# Patient Record
Sex: Female | Born: 2000 | Race: Black or African American | Hispanic: No | Marital: Single | Smoking: Never smoker
Health system: Southern US, Community
[De-identification: ages and names within clinical notes are randomized; demographics above are authoritative.]

## PROBLEM LIST (undated history)

## (undated) DIAGNOSIS — E282 Polycystic ovarian syndrome: Secondary | ICD-10-CM

## (undated) DIAGNOSIS — J45909 Unspecified asthma, uncomplicated: Secondary | ICD-10-CM

---
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
## 2020-02-17 ENCOUNTER — Emergency Department (HOSPITAL_BASED_OUTPATIENT_CLINIC_OR_DEPARTMENT_OTHER)
Admission: EM | Admit: 2020-02-17 | Discharge: 2020-02-17 | Disposition: A | Discharge status: Home / Self Care | Payer: No Typology Code available for payment source | Attending: Emergency Medicine | Admitting: Emergency Medicine

## 2020-02-17 ENCOUNTER — Other Ambulatory Visit: Payer: Self-pay

## 2020-02-17 ENCOUNTER — Encounter (HOSPITAL_BASED_OUTPATIENT_CLINIC_OR_DEPARTMENT_OTHER): Payer: Self-pay | Admitting: Oncology

## 2020-02-17 ENCOUNTER — Emergency Department (HOSPITAL_BASED_OUTPATIENT_CLINIC_OR_DEPARTMENT_OTHER): Payer: No Typology Code available for payment source

## 2020-02-17 DIAGNOSIS — J45909 Unspecified asthma, uncomplicated: Secondary | ICD-10-CM | POA: Insufficient documentation

## 2020-02-17 DIAGNOSIS — M25512 Pain in left shoulder: Secondary | ICD-10-CM | POA: Insufficient documentation

## 2020-02-17 DIAGNOSIS — R0789 Other chest pain: Secondary | ICD-10-CM | POA: Diagnosis not present

## 2020-02-17 DIAGNOSIS — S298XXA Other specified injuries of thorax, initial encounter: Secondary | ICD-10-CM

## 2020-02-17 MED ORDER — NAPROXEN 250 MG PO TABS
500.0000 mg | ORAL_TABLET | Freq: Once | ORAL | Status: AC
  Administered 2020-02-17: 500 mg via ORAL
  Filled 2020-02-17: qty 2

## 2020-02-17 MED ORDER — NAPROXEN 375 MG PO TABS: ORAL_TABLET | ORAL | 0 refills | Status: AC
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
## 2020-02-18 ENCOUNTER — Emergency Department (HOSPITAL_BASED_OUTPATIENT_CLINIC_OR_DEPARTMENT_OTHER): Payer: No Typology Code available for payment source

## 2020-02-18 ENCOUNTER — Encounter (HOSPITAL_BASED_OUTPATIENT_CLINIC_OR_DEPARTMENT_OTHER): Payer: Self-pay | Admitting: Emergency Medicine

## 2020-02-18 ENCOUNTER — Emergency Department (HOSPITAL_BASED_OUTPATIENT_CLINIC_OR_DEPARTMENT_OTHER)
Admission: EM | Admit: 2020-02-18 | Discharge: 2020-02-18 | Disposition: A | Discharge status: Home / Self Care | Payer: No Typology Code available for payment source | Attending: Emergency Medicine | Admitting: Emergency Medicine

## 2020-02-18 ENCOUNTER — Other Ambulatory Visit: Payer: Self-pay

## 2020-02-18 DIAGNOSIS — M62838 Other muscle spasm: Secondary | ICD-10-CM | POA: Insufficient documentation

## 2020-02-18 DIAGNOSIS — R0602 Shortness of breath: Secondary | ICD-10-CM | POA: Diagnosis present

## 2020-02-18 MED ORDER — CYCLOBENZAPRINE HCL 10 MG PO TABS: 10.0000 mg | ORAL_TABLET | Freq: Two times a day (BID) | ORAL | 0 refills | Status: AC | PRN
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
## 2020-02-23 ENCOUNTER — Encounter: Payer: Self-pay | Admitting: Family Medicine

## 2020-02-23 ENCOUNTER — Other Ambulatory Visit: Payer: Self-pay

## 2020-02-23 ENCOUNTER — Ambulatory Visit (INDEPENDENT_AMBULATORY_CARE_PROVIDER_SITE_OTHER): Payer: No Typology Code available for payment source | Admitting: Family Medicine

## 2020-02-23 VITALS — BP 119/80 | HR 99 | Ht 61.0 in | Wt 210.0 lb

## 2020-02-23 DIAGNOSIS — M25512 Pain in left shoulder: Secondary | ICD-10-CM | POA: Diagnosis not present

## 2020-02-23 DIAGNOSIS — M542 Cervicalgia: Secondary | ICD-10-CM | POA: Insufficient documentation

## 2020-02-23 DIAGNOSIS — M546 Pain in thoracic spine: Secondary | ICD-10-CM | POA: Insufficient documentation

## 2020-02-23 DIAGNOSIS — M7918 Myalgia, other site: Secondary | ICD-10-CM | POA: Insufficient documentation

## 2020-02-23 NOTE — Patient Instructions (Signed)
Nice to meet you Please try a thera cane  Please try heat  Please try the exercises  Physical therapy will give you a call  Please send me a message in MyChart with any questions or updates.  Please see me back in 4 weeks.   --Dr. Jordan Likes

## 2020-02-23 NOTE — Assessment & Plan Note (Signed)
Seems muscular in nature.  No signs of fracture. -Counseled on home exercise therapy and supportive care. -Referral to physical therapy. -Could consider imaging or trigger point injections.
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
## 2020-02-23 NOTE — Assessment & Plan Note (Signed)
Likely myofascial in nature. -Referral to physical therapy. -Counseled on home exercise therapy and supportive care. -Consider imaging or trigger point injections.

## 2020-03-02 ENCOUNTER — Other Ambulatory Visit: Payer: Self-pay

## 2020-03-02 ENCOUNTER — Ambulatory Visit: Payer: No Typology Code available for payment source | Attending: Family Medicine | Admitting: Physical Therapy

## 2020-03-02 ENCOUNTER — Encounter: Payer: Self-pay | Admitting: Physical Therapy

## 2020-03-02 DIAGNOSIS — M546 Pain in thoracic spine: Secondary | ICD-10-CM | POA: Diagnosis present

## 2020-03-02 DIAGNOSIS — R29898 Other symptoms and signs involving the musculoskeletal system: Secondary | ICD-10-CM | POA: Insufficient documentation

## 2020-03-02 DIAGNOSIS — M542 Cervicalgia: Secondary | ICD-10-CM | POA: Insufficient documentation

## 2020-03-02 DIAGNOSIS — R293 Abnormal posture: Secondary | ICD-10-CM | POA: Diagnosis present

## 2020-03-02 DIAGNOSIS — M25512 Pain in left shoulder: Secondary | ICD-10-CM | POA: Insufficient documentation
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
## 2020-03-09 ENCOUNTER — Ambulatory Visit: Payer: No Typology Code available for payment source | Admitting: Physical Therapy

## 2020-03-10 ENCOUNTER — Ambulatory Visit: Payer: No Typology Code available for payment source | Admitting: Physical Therapy

## 2020-03-10 ENCOUNTER — Other Ambulatory Visit: Payer: Self-pay

## 2020-03-10 ENCOUNTER — Encounter: Payer: Self-pay | Admitting: Physical Therapy

## 2020-03-10 DIAGNOSIS — M25512 Pain in left shoulder: Secondary | ICD-10-CM | POA: Diagnosis not present

## 2020-03-10 DIAGNOSIS — R29898 Other symptoms and signs involving the musculoskeletal system: Secondary | ICD-10-CM

## 2020-03-10 DIAGNOSIS — M546 Pain in thoracic spine: Secondary | ICD-10-CM

## 2020-03-10 DIAGNOSIS — R293 Abnormal posture: Secondary | ICD-10-CM

## 2020-03-10 DIAGNOSIS — M542 Cervicalgia: Secondary | ICD-10-CM
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
## 2020-03-11 ENCOUNTER — Ambulatory Visit: Payer: No Typology Code available for payment source | Admitting: Physical Therapy

## 2020-03-11 ENCOUNTER — Encounter: Payer: Self-pay | Admitting: Physical Therapy

## 2020-03-11 DIAGNOSIS — M25512 Pain in left shoulder: Secondary | ICD-10-CM

## 2020-03-11 DIAGNOSIS — M542 Cervicalgia: Secondary | ICD-10-CM

## 2020-03-11 DIAGNOSIS — R29898 Other symptoms and signs involving the musculoskeletal system: Secondary | ICD-10-CM

## 2020-03-11 DIAGNOSIS — R293 Abnormal posture: Secondary | ICD-10-CM

## 2020-03-11 DIAGNOSIS — M546 Pain in thoracic spine: Secondary | ICD-10-CM
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
## 2020-03-16 ENCOUNTER — Other Ambulatory Visit: Payer: Self-pay

## 2020-03-16 ENCOUNTER — Ambulatory Visit: Payer: No Typology Code available for payment source

## 2020-03-16 DIAGNOSIS — R29898 Other symptoms and signs involving the musculoskeletal system: Secondary | ICD-10-CM

## 2020-03-16 DIAGNOSIS — M542 Cervicalgia: Secondary | ICD-10-CM

## 2020-03-16 DIAGNOSIS — M25512 Pain in left shoulder: Secondary | ICD-10-CM

## 2020-03-16 DIAGNOSIS — M546 Pain in thoracic spine: Secondary | ICD-10-CM

## 2020-03-16 DIAGNOSIS — R293 Abnormal posture: Secondary | ICD-10-CM
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
## 2020-03-18 ENCOUNTER — Ambulatory Visit: Payer: No Typology Code available for payment source | Attending: Family Medicine

## 2020-03-18 ENCOUNTER — Other Ambulatory Visit: Payer: Self-pay

## 2020-03-18 DIAGNOSIS — M546 Pain in thoracic spine: Secondary | ICD-10-CM | POA: Insufficient documentation

## 2020-03-18 DIAGNOSIS — R29898 Other symptoms and signs involving the musculoskeletal system: Secondary | ICD-10-CM | POA: Diagnosis present

## 2020-03-18 DIAGNOSIS — M25512 Pain in left shoulder: Secondary | ICD-10-CM | POA: Insufficient documentation

## 2020-03-18 DIAGNOSIS — M542 Cervicalgia: Secondary | ICD-10-CM | POA: Diagnosis present

## 2020-03-18 DIAGNOSIS — R293 Abnormal posture: Secondary | ICD-10-CM | POA: Diagnosis present
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
## 2020-03-22 ENCOUNTER — Ambulatory Visit (INDEPENDENT_AMBULATORY_CARE_PROVIDER_SITE_OTHER): Payer: No Typology Code available for payment source | Admitting: Family Medicine

## 2020-03-22 ENCOUNTER — Other Ambulatory Visit: Payer: Self-pay

## 2020-03-22 ENCOUNTER — Encounter: Payer: Self-pay | Admitting: Family Medicine

## 2020-03-22 VITALS — BP 111/73 | HR 94 | Ht 62.0 in | Wt 205.0 lb

## 2020-03-22 DIAGNOSIS — M7918 Myalgia, other site: Secondary | ICD-10-CM | POA: Diagnosis not present

## 2020-03-22 DIAGNOSIS — M546 Pain in thoracic spine: Secondary | ICD-10-CM | POA: Diagnosis not present

## 2020-03-22 DIAGNOSIS — M542 Cervicalgia: Secondary | ICD-10-CM

## 2020-03-22 MED ORDER — METHYLPREDNISOLONE ACETATE 40 MG/ML IJ SUSP
40.0000 mg | Freq: Once | INTRAMUSCULAR | Status: AC
  Administered 2020-03-22: 15:00:00 40 mg via INTRA_ARTICULAR

## 2020-03-22 NOTE — Patient Instructions (Signed)
Good to see you Please try heat  Please try a lacrosse ball or a thera cane to massage the area  Please continue physical therapy   Please send me a message in MyChart with any questions or updates.  Please see me back in 6 weeks.   --Dr. Jordan Likes

## 2020-03-22 NOTE — Assessment & Plan Note (Signed)
Seems less shoulder related in the sense of no rotator cuff or joint pathology but more trigger points around the scapular region to cause some of the pain. -Trigger point injections. -Counseled on home exercise therapy and supportive care. -Could consider further imaging if needed.

## 2020-03-22 NOTE — Addendum Note (Signed)
Addended by: Kathi Simpers F on: 03/22/2020 03:14 PM   Modules accepted: Orders

## 2020-03-22 NOTE — Assessment & Plan Note (Signed)
Doing well. -Continue physical therapy. -Can follow-up as needed.

## 2020-03-22 NOTE — Progress Notes (Signed)
Regina Gibson - 19 y.o. female MRN [id number]  Date of birth: [birth year]  SUBJECTIVE:  Including CC & ROS.  No chief complaint on file.   Regina Gibson is a 19 y.o. female that is following up for her neck, back and shoulder pain.  She has been going through physical therapy and doing well.  She only notices posterior shoulder pain from time to time.  It seems to be worse when she is doing homework or having to sit for prolonged periods of time.  She notices in the lateral trapezius.  No radicular symptoms.  No specific changes of the shoulder joint itself.   Review of Systems See HPI   HISTORY: Past Medical, Surgical, Social, and Family History Reviewed & Updated per EMR.   Pertinent Historical Findings include:  Past Medical History:  Diagnosis Date  . Asthma     No past surgical history on file.  No family history on file.  Social History   Socioeconomic History  . Marital status: Single    Spouse name: Not on file  . Number of children: Not on file  . Years of education: Not on file  . Highest education level: Not on file  Occupational History  . Not on file  Tobacco Use  . Smoking status: Never Smoker  . Smokeless tobacco: Never Used  Substance and Sexual Activity  . Alcohol use: No  . Drug use: Not Currently  . Sexual activity: Yes    Birth control/protection: Implant  Other Topics Concern  . Not on file  Social History Narrative  . Not on file   Social Determinants of Health   Financial Resource Strain:   . Difficulty of Paying Living Expenses:   Food Insecurity:   . Worried About Programme researcher, broadcasting/film/video in the Last Year:   . Barista in the Last Year:   Transportation Needs:   . Freight forwarder (Medical):   Marland Kitchen Lack of Transportation (Non-Medical):   Physical Activity:   . Days of Exercise per Week:   . Minutes of Exercise per Session:   Stress:   . Feeling of Stress :   Social Connections:   . Frequency of Communication with Friends and  Family:   . Frequency of Social Gatherings with Friends and Family:   . Attends Religious Services:   . Active Member of Clubs or Organizations:   . Attends Banker Meetings:   Marland Kitchen Marital Status:   Intimate Partner Violence:   . Fear of Current or Ex-Partner:   . Emotionally Abused:   Marland Kitchen Physically Abused:   . Sexually Abused:      PHYSICAL EXAM:  VS: There were no vitals taken for this visit. Physical Exam Gen: NAD, alert, cooperative with exam, well-appearing MSK:  Left shoulder: Tender points along the medial border of the scapula as well as the trapezius. Normal internal and external rotation. Normal strength resistance. Negative empty can test. Neurovascular intact   Aspiration/Injection Procedure Note Regina Gibson 2001-09-09  Procedure: Injection Indications: Left trigger points left trapezius and rhomboids  Procedure Details Consent: Risks of procedure as well as the alternatives and risks of each were explained to the (patient/caregiver).  Consent for procedure obtained. Time Out: Verified patient identification, verified procedure, site/side was marked, verified correct patient position, special equipment/implants available, medications/allergies/relevent history reviewed, required imaging and test results available.  Performed.  The area was cleaned with iodine and alcohol swabs.    The left trigger points of  the rhomboids and trapezius was injected using 1 cc's of 40 mg Depo-Medrol and 4 cc's of 0.25% bupivacaine with a 25 1 " needle.       A sterile dressing was applied.  Patient did tolerate procedure well.     ASSESSMENT & PLAN:   Myofascial pain on left side Seems less shoulder related in the sense of no rotator cuff or joint pathology but more trigger points around the scapular region to cause some of the pain. -Trigger point injections. -Counseled on home exercise therapy and supportive care. -Could consider further imaging if  needed.  Acute left-sided thoracic back pain Doing well. -Continue physical therapy. -Can follow-up as needed.  Neck pain Doing well. -Can continue physical therapy. -Follow-up as needed.

## 2020-03-22 NOTE — Assessment & Plan Note (Signed)
Doing well. -Can continue physical therapy. -Follow-up as needed.

## 2020-03-23 ENCOUNTER — Ambulatory Visit: Payer: No Typology Code available for payment source

## 2020-03-23 DIAGNOSIS — M546 Pain in thoracic spine: Secondary | ICD-10-CM

## 2020-03-23 DIAGNOSIS — R293 Abnormal posture: Secondary | ICD-10-CM

## 2020-03-23 DIAGNOSIS — M25512 Pain in left shoulder: Secondary | ICD-10-CM | POA: Diagnosis not present

## 2020-03-23 DIAGNOSIS — M542 Cervicalgia: Secondary | ICD-10-CM
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
## 2020-03-25 ENCOUNTER — Ambulatory Visit: Payer: No Typology Code available for payment source | Admitting: Physical Therapy

## 2020-03-28 ENCOUNTER — Ambulatory Visit: Payer: No Typology Code available for payment source | Admitting: Physical Therapy

## 2020-03-30 ENCOUNTER — Other Ambulatory Visit: Payer: Self-pay

## 2020-03-30 ENCOUNTER — Ambulatory Visit: Payer: No Typology Code available for payment source

## 2020-03-30 DIAGNOSIS — M25512 Pain in left shoulder: Secondary | ICD-10-CM | POA: Diagnosis not present

## 2020-03-30 DIAGNOSIS — R293 Abnormal posture: Secondary | ICD-10-CM

## 2020-03-30 DIAGNOSIS — M546 Pain in thoracic spine: Secondary | ICD-10-CM

## 2020-03-30 DIAGNOSIS — M542 Cervicalgia: Secondary | ICD-10-CM
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
## 2020-04-06 ENCOUNTER — Ambulatory Visit: Payer: No Typology Code available for payment source

## 2020-04-06 ENCOUNTER — Other Ambulatory Visit: Payer: Self-pay

## 2020-04-06 DIAGNOSIS — R29898 Other symptoms and signs involving the musculoskeletal system: Secondary | ICD-10-CM

## 2020-04-06 DIAGNOSIS — M546 Pain in thoracic spine: Secondary | ICD-10-CM

## 2020-04-06 DIAGNOSIS — R293 Abnormal posture: Secondary | ICD-10-CM

## 2020-04-06 DIAGNOSIS — M542 Cervicalgia: Secondary | ICD-10-CM

## 2020-04-06 DIAGNOSIS — M25512 Pain in left shoulder: Secondary | ICD-10-CM
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
## 2020-04-13 ENCOUNTER — Ambulatory Visit: Payer: No Typology Code available for payment source

## 2020-04-13 ENCOUNTER — Other Ambulatory Visit: Payer: Self-pay

## 2020-04-13 DIAGNOSIS — M25512 Pain in left shoulder: Secondary | ICD-10-CM | POA: Diagnosis not present

## 2020-04-13 DIAGNOSIS — M546 Pain in thoracic spine: Secondary | ICD-10-CM

## 2020-04-13 DIAGNOSIS — R293 Abnormal posture: Secondary | ICD-10-CM

## 2020-04-13 DIAGNOSIS — R29898 Other symptoms and signs involving the musculoskeletal system: Secondary | ICD-10-CM

## 2020-04-13 DIAGNOSIS — M542 Cervicalgia: Secondary | ICD-10-CM
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
## 2020-04-15 ENCOUNTER — Other Ambulatory Visit: Payer: Self-pay

## 2020-04-15 ENCOUNTER — Ambulatory Visit: Payer: No Typology Code available for payment source | Admitting: Physical Therapy

## 2020-04-15 ENCOUNTER — Encounter: Payer: Self-pay | Admitting: Physical Therapy

## 2020-04-15 DIAGNOSIS — R29898 Other symptoms and signs involving the musculoskeletal system: Secondary | ICD-10-CM

## 2020-04-15 DIAGNOSIS — M25512 Pain in left shoulder: Secondary | ICD-10-CM

## 2020-04-15 DIAGNOSIS — R293 Abnormal posture: Secondary | ICD-10-CM

## 2020-04-15 DIAGNOSIS — M542 Cervicalgia: Secondary | ICD-10-CM

## 2020-04-15 DIAGNOSIS — M546 Pain in thoracic spine: Secondary | ICD-10-CM
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
## 2020-04-19 ENCOUNTER — Encounter: Payer: Self-pay | Admitting: Physical Therapy

## 2020-04-19 ENCOUNTER — Ambulatory Visit: Payer: No Typology Code available for payment source | Attending: Family Medicine | Admitting: Physical Therapy

## 2020-04-19 ENCOUNTER — Other Ambulatory Visit: Payer: Self-pay

## 2020-04-19 DIAGNOSIS — M25512 Pain in left shoulder: Secondary | ICD-10-CM | POA: Insufficient documentation

## 2020-04-19 DIAGNOSIS — R293 Abnormal posture: Secondary | ICD-10-CM | POA: Diagnosis present

## 2020-04-19 DIAGNOSIS — M542 Cervicalgia: Secondary | ICD-10-CM | POA: Insufficient documentation

## 2020-04-19 DIAGNOSIS — R29898 Other symptoms and signs involving the musculoskeletal system: Secondary | ICD-10-CM | POA: Diagnosis present

## 2020-04-19 DIAGNOSIS — M546 Pain in thoracic spine: Secondary | ICD-10-CM | POA: Diagnosis present
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
## 2020-04-27 ENCOUNTER — Ambulatory Visit: Payer: No Typology Code available for payment source | Admitting: Physical Therapy

## 2020-04-27 ENCOUNTER — Other Ambulatory Visit: Payer: Self-pay

## 2020-04-27 ENCOUNTER — Encounter: Payer: Self-pay | Admitting: Physical Therapy

## 2020-04-27 DIAGNOSIS — M25512 Pain in left shoulder: Secondary | ICD-10-CM

## 2020-04-27 DIAGNOSIS — M542 Cervicalgia: Secondary | ICD-10-CM

## 2020-04-27 DIAGNOSIS — R293 Abnormal posture: Secondary | ICD-10-CM

## 2020-04-27 DIAGNOSIS — R29898 Other symptoms and signs involving the musculoskeletal system: Secondary | ICD-10-CM

## 2020-04-27 DIAGNOSIS — M546 Pain in thoracic spine: Secondary | ICD-10-CM
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
## 2020-04-27 NOTE — Patient Instructions (Addendum)
TENS stands for Transcutaneous Electrical Nerve Stimulation. In other words, electrical impulses are allowed to pass through the skin in order to excite a nerve.   Purpose and Use of TENS:  TENS is a method used to manage acute and chronic pain without the use of drugs. It has been effective in managing pain associated with surgery, sprains, strains, trauma, rheumatoid arthritis, and neuralgias. It is a non-addictive, low risk, and non-invasive technique used to control pain. It is not, by any means, a curative form of treatment.   How TENS Works:  Most TENS units are a small pocket-sized unit powered by one 9 volt battery. Attached to the outside of the unit are two lead wires where two pins and/or snaps connect on each wire. All units come with a set of four reusable pads or electrodes. These are placed on the skin surrounding the area involved. By inserting the leads into  the pads, the electricity can pass from the unit making the circuit complete.  As the intensity is turned up slowly, the electrical current enters the body from the electrodes through the skin to the surrounding nerve fibers. This triggers the release of hormones from within the body. These hormones contain pain relievers. By increasing the circulation of these hormones, the person's pain may be lessened. It is also believed that the electrical stimulation itself helps to block the pain messages being sent to the brain, thus also decreasing the body's perception of pain.   Hazards:  TENS units are NOT to be used by patients with PACEMAKERS, DEFIBRILLATORS, DIABETIC PUMPS, PREGNANT WOMEN, and patients with SEIZURE DISORDERS.  TENS units are NOT to be used over the heart, throat, brain, or spinal cord.  One of the major side effects from the TENS unit may be skin irritation. Some people may develop a rash if they are sensitive to the materials used in the electrodes or the connecting wires.   Wear the unit for 15 minutes.   Avoid  overuse due the body getting used to the stem making it not as effective over time.   TENS UNIT  This is helpful for muscle pain and spasm.   Search and Purchase a TENS 7000 2nd edition at www.tenspros.com or www.amazon.com  (It should be less than $30)     TENS unit instructions:   Do not shower or bathe with the unit on  Turn the unit off before removing electrodes or batteries  If the electrodes lose stickiness add a drop of water to the electrodes after they are disconnected from the unit and place on plastic sheet. If you continued to have difficulty, call the TENS unit company to purchase more electrodes.  Do not apply lotion on the skin area prior to use. Make sure the skin is clean and dry as this will help prolong the life of the electrodes.  After use, always check skin for unusual red areas, rash or other skin difficulties. If there are any skin problems, does not apply electrodes to the same area.  Never remove the electrodes from the unit by pulling the wires.  Do not use the TENS unit or electrodes other than as directed.  Do not change electrode placement without consulting your therapist or physician.  Keep 2 fingers with between each electrode.   Kinesiology tape  What is kinesiology tape?  There are many brands of kinesiology tape. KTape, Rock Tape, Body Sport, Dynamic tape, to name a few.  It is an elasticized tape designed to   support the body's natural healing process. This tape provides stability and support to muscles and joints without restricting motion.  It can also help decrease swelling in the area of application.  How does it work?  The tape microscopically lifts and decompresses the skin to allow for drainage of lymph (swelling) to flow away from area, reducing inflammation. The tape has the ability to help re-educate the neuromuscular system by targeting specific receptors in the skin. The presence of the tape increases the body's awareness of  posture and body mechanics.  Do not use with:  . Open wounds . Skin lesions . Adhesive allergies  In some rare cases, mild/moderate skin irritation can occur. This can include redness, itchiness, or hives. If this occurs, immediately remove tape and consult your primary care physician if symptoms are severe or do not resolve within 2 days.  Safe removal of the tape:  To remove tape safely, hold nearby skin with one hand and gentle roll tape down with other hand. You can apply oil or conditioner to tape while in shower prior to removal to loosen adhesive. DO NOT swiftly rip tape off like a band-aid, as this could cause skin tears and additional skin irritation.     For questions, please contact your therapist at:  [facility] [address]  [address] [place], [place], [address] Phone: [phone]   Fax:  [phone]  

## 2020-05-03 ENCOUNTER — Ambulatory Visit: Payer: No Typology Code available for payment source | Admitting: Family Medicine

## 2020-05-04 ENCOUNTER — Ambulatory Visit: Payer: No Typology Code available for payment source

## 2020-05-04 ENCOUNTER — Ambulatory Visit: Payer: No Typology Code available for payment source | Admitting: Family Medicine

## 2020-05-04 ENCOUNTER — Ambulatory Visit (HOSPITAL_BASED_OUTPATIENT_CLINIC_OR_DEPARTMENT_OTHER)
Admission: RE | Admit: 2020-05-04 | Discharge: 2020-05-04 | Disposition: A | Discharge status: Home / Self Care | Payer: No Typology Code available for payment source | Source: Ambulatory Visit | Attending: Family Medicine | Admitting: Family Medicine

## 2020-05-04 ENCOUNTER — Encounter: Payer: Self-pay | Admitting: Family Medicine

## 2020-05-04 ENCOUNTER — Other Ambulatory Visit: Payer: Self-pay

## 2020-05-04 VITALS — BP 114/75 | HR 94 | Ht 62.0 in | Wt 198.0 lb

## 2020-05-04 DIAGNOSIS — M25512 Pain in left shoulder: Secondary | ICD-10-CM | POA: Diagnosis not present

## 2020-05-04 DIAGNOSIS — R29898 Other symptoms and signs involving the musculoskeletal system: Secondary | ICD-10-CM

## 2020-05-04 DIAGNOSIS — M546 Pain in thoracic spine: Secondary | ICD-10-CM

## 2020-05-04 DIAGNOSIS — M542 Cervicalgia: Secondary | ICD-10-CM

## 2020-05-04 DIAGNOSIS — R293 Abnormal posture: Secondary | ICD-10-CM
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
## 2020-05-04 NOTE — Patient Instructions (Addendum)
Good to see you Please get the xray today. I will call with the results.  Please continue the range of motion  Please call [facility] in a couple of days to schedule the MRI   Please send me a message in MyChart with any questions or updates.  We will setup a virtual visit once the MRI is resulted .   --Dr. Jordan Likes
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
## 2020-05-05 ENCOUNTER — Telehealth: Payer: Self-pay | Admitting: Family Medicine

## 2020-05-05 NOTE — Telephone Encounter (Signed)
Left VM for patient. If she calls back please have her speak with a nurse/CMA and inform that her xrays are normal. We will proceed as planned with the mRI .   If any questions then please take the best time and phone number to call and I will try to call her back.   Myra Rude, MD [facility] 05/05/2020, 9:08 AM

## 2020-05-11 ENCOUNTER — Ambulatory Visit: Payer: No Typology Code available for payment source

## 2020-05-18 ENCOUNTER — Other Ambulatory Visit: Payer: Self-pay

## 2020-05-18 ENCOUNTER — Ambulatory Visit: Payer: No Typology Code available for payment source | Attending: Family Medicine

## 2020-05-18 DIAGNOSIS — R29898 Other symptoms and signs involving the musculoskeletal system: Secondary | ICD-10-CM | POA: Diagnosis present

## 2020-05-18 DIAGNOSIS — M542 Cervicalgia: Secondary | ICD-10-CM | POA: Insufficient documentation

## 2020-05-18 DIAGNOSIS — M546 Pain in thoracic spine: Secondary | ICD-10-CM | POA: Diagnosis present

## 2020-05-18 DIAGNOSIS — M25512 Pain in left shoulder: Secondary | ICD-10-CM | POA: Insufficient documentation

## 2020-05-18 DIAGNOSIS — R293 Abnormal posture: Secondary | ICD-10-CM | POA: Diagnosis present
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
## 2020-05-25 ENCOUNTER — Other Ambulatory Visit: Payer: Self-pay

## 2020-05-25 ENCOUNTER — Ambulatory Visit: Payer: No Typology Code available for payment source

## 2020-05-25 DIAGNOSIS — R29898 Other symptoms and signs involving the musculoskeletal system: Secondary | ICD-10-CM

## 2020-05-25 DIAGNOSIS — M25512 Pain in left shoulder: Secondary | ICD-10-CM

## 2020-05-25 DIAGNOSIS — M546 Pain in thoracic spine: Secondary | ICD-10-CM

## 2020-05-25 DIAGNOSIS — M542 Cervicalgia: Secondary | ICD-10-CM

## 2020-05-25 DIAGNOSIS — R293 Abnormal posture: Secondary | ICD-10-CM
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
## 2020-06-01 ENCOUNTER — Ambulatory Visit: Payer: No Typology Code available for payment source | Admitting: Physical Therapy

## 2020-06-07 ENCOUNTER — Other Ambulatory Visit: Payer: Self-pay

## 2020-06-07 ENCOUNTER — Encounter: Payer: Self-pay | Admitting: Physical Therapy

## 2020-06-07 ENCOUNTER — Ambulatory Visit: Payer: No Typology Code available for payment source | Admitting: Physical Therapy

## 2020-06-07 DIAGNOSIS — M542 Cervicalgia: Secondary | ICD-10-CM

## 2020-06-07 DIAGNOSIS — M546 Pain in thoracic spine: Secondary | ICD-10-CM

## 2020-06-07 DIAGNOSIS — R29898 Other symptoms and signs involving the musculoskeletal system: Secondary | ICD-10-CM

## 2020-06-07 DIAGNOSIS — M25512 Pain in left shoulder: Secondary | ICD-10-CM | POA: Diagnosis not present

## 2020-06-07 DIAGNOSIS — R293 Abnormal posture: Secondary | ICD-10-CM
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
## 2020-06-16 ENCOUNTER — Other Ambulatory Visit: Payer: Self-pay | Admitting: Family Medicine

## 2020-06-18 ENCOUNTER — Other Ambulatory Visit: Payer: No Typology Code available for payment source
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
## 2022-12-02 ENCOUNTER — Emergency Department (HOSPITAL_BASED_OUTPATIENT_CLINIC_OR_DEPARTMENT_OTHER)
Admission: EM | Admit: 2022-12-02 | Discharge: 2022-12-02 | Discharge status: Against Medical Advice | Payer: BC Managed Care – PPO | Attending: Emergency Medicine | Admitting: Emergency Medicine

## 2022-12-02 ENCOUNTER — Encounter (HOSPITAL_BASED_OUTPATIENT_CLINIC_OR_DEPARTMENT_OTHER): Payer: Self-pay | Admitting: Emergency Medicine

## 2022-12-02 ENCOUNTER — Other Ambulatory Visit: Payer: Self-pay

## 2022-12-02 DIAGNOSIS — R062 Wheezing: Secondary | ICD-10-CM | POA: Insufficient documentation

## 2022-12-02 DIAGNOSIS — R059 Cough, unspecified: Secondary | ICD-10-CM | POA: Diagnosis not present

## 2022-12-02 DIAGNOSIS — Z5321 Procedure and treatment not carried out due to patient leaving prior to being seen by health care provider: Secondary | ICD-10-CM | POA: Diagnosis not present

## 2022-12-02 DIAGNOSIS — Z1152 Encounter for screening for COVID-19: Secondary | ICD-10-CM | POA: Diagnosis not present

## 2022-12-02 HISTORY — DX: Polycystic ovarian syndrome: E28.2

## 2022-12-02 LAB — RESP PANEL BY RT-PCR (RSV, FLU A&B, COVID)  RVPGX2
Influenza A by PCR: NEGATIVE
Influenza B by PCR: NEGATIVE
Resp Syncytial Virus by PCR: NEGATIVE
SARS Coronavirus 2 by RT PCR: NEGATIVE

## 2022-12-02 MED ORDER — ALBUTEROL SULFATE (2.5 MG/3ML) 0.083% IN NEBU: 2.5000 mg | INHALATION_SOLUTION | Freq: Once | RESPIRATORY_TRACT | Status: AC

## 2022-12-02 MED ORDER — ALBUTEROL SULFATE HFA 108 (90 BASE) MCG/ACT IN AERS: 2.0000 | INHALATION_SPRAY | RESPIRATORY_TRACT | Status: DC | PRN

## 2022-12-02 MED ORDER — IPRATROPIUM-ALBUTEROL 0.5-2.5 (3) MG/3ML IN SOLN
RESPIRATORY_TRACT | Status: AC
  Administered 2022-12-02: 3 mL via RESPIRATORY_TRACT
  Filled 2022-12-02: qty 3

## 2022-12-02 MED ORDER — ALBUTEROL SULFATE (2.5 MG/3ML) 0.083% IN NEBU
INHALATION_SOLUTION | RESPIRATORY_TRACT | Status: AC
  Administered 2022-12-02: 2.5 mg via RESPIRATORY_TRACT
  Filled 2022-12-02: qty 3

## 2022-12-02 MED ORDER — IPRATROPIUM-ALBUTEROL 0.5-2.5 (3) MG/3ML IN SOLN: 3.0000 mL | Freq: Once | RESPIRATORY_TRACT | Status: AC

## 2022-12-02 MED ORDER — AEROCHAMBER PLUS FLO-VU LARGE MISC
1.0000 | Freq: Once | Status: DC
  Filled 2022-12-02: qty 1

## 2022-12-02 NOTE — ED Triage Notes (Signed)
Pt c/o wheezing and coughing since yesterday. Pt has hx of asthma, has not had to use an inhaler years.

## 2023-04-04 ENCOUNTER — Encounter: Payer: Self-pay | Admitting: *Deleted

## 2023-06-19 ENCOUNTER — Emergency Department (HOSPITAL_BASED_OUTPATIENT_CLINIC_OR_DEPARTMENT_OTHER)
Admission: EM | Admit: 2023-06-19 | Discharge: 2023-06-19 | Disposition: A | Discharge status: Home / Self Care | Payer: BC Managed Care – PPO | Attending: Emergency Medicine | Admitting: Emergency Medicine

## 2023-06-19 ENCOUNTER — Other Ambulatory Visit: Payer: Self-pay

## 2023-06-19 ENCOUNTER — Encounter (HOSPITAL_BASED_OUTPATIENT_CLINIC_OR_DEPARTMENT_OTHER): Payer: Self-pay

## 2023-06-19 DIAGNOSIS — J45909 Unspecified asthma, uncomplicated: Secondary | ICD-10-CM | POA: Insufficient documentation

## 2023-06-19 DIAGNOSIS — L03115 Cellulitis of right lower limb: Secondary | ICD-10-CM | POA: Insufficient documentation

## 2023-06-19 DIAGNOSIS — L03119 Cellulitis of unspecified part of limb: Secondary | ICD-10-CM

## 2023-06-19 DIAGNOSIS — S80869A Insect bite (nonvenomous), unspecified lower leg, initial encounter: Secondary | ICD-10-CM

## 2023-06-19 DIAGNOSIS — S80862A Insect bite (nonvenomous), left lower leg, initial encounter: Secondary | ICD-10-CM | POA: Insufficient documentation

## 2023-06-19 DIAGNOSIS — L03116 Cellulitis of left lower limb: Secondary | ICD-10-CM | POA: Diagnosis not present

## 2023-06-19 DIAGNOSIS — S80861A Insect bite (nonvenomous), right lower leg, initial encounter: Secondary | ICD-10-CM | POA: Diagnosis present

## 2023-06-19 DIAGNOSIS — W57XXXA Bitten or stung by nonvenomous insect and other nonvenomous arthropods, initial encounter: Secondary | ICD-10-CM | POA: Diagnosis not present

## 2023-06-19 MED ORDER — CEPHALEXIN 250 MG PO CAPS: 250.0000 mg | ORAL_CAPSULE | Freq: Four times a day (QID) | ORAL | 0 refills | Status: AC

## 2023-06-19 MED ORDER — DIPHENHYDRAMINE HCL 25 MG PO TABS: 25.0000 mg | ORAL_TABLET | Freq: Four times a day (QID) | ORAL | 0 refills | Status: AC

## 2023-06-19 MED ORDER — HYDROCORTISONE 1 % EX CREA: TOPICAL_CREAM | CUTANEOUS | 0 refills | Status: AC

## 2023-06-19 NOTE — ED Triage Notes (Signed)
Patient here POV from Home.  Endorses being stung by insect in [place] to Posterior Lower Left Leg. Noted some drainage originally that has since subsided. No Fevers.  NAD Noted during triage. A&Ox4. GCS 15. Ambulatory.

## 2023-06-19 NOTE — ED Provider Notes (Signed)
[place] EMERGENCY DEPARTMENT AT [facility] Provider Note   CSN: [id number] Arrival date & time: 06/19/23  1944     History {Add pertinent medical, surgical, social history, OB history to HPI:1} Chief Complaint  Patient presents with   Insect Bite    Regina Gibson is a 22 y.o. female past medical history of asthma, PCOS presents today for evaluation of insect bite.  Patient endorses being stung by insect at [place] on multiple sites on both of her legs.  She noticed some fluid drainage from some of the wounds.  She has not tried any medication.  She denies any fever, nausea or vomiting.  HPI    Past Medical History:  Diagnosis Date   Asthma    PCOS (polycystic ovarian syndrome)    History reviewed. No pertinent surgical history.   Home Medications Prior to Admission medications   Medication Sig Start Date End Date Taking? Authorizing Provider  albuterol (PROVENTIL HFA;VENTOLIN HFA) 108 (90 BASE) MCG/ACT inhaler Inhale 2 puffs into the lungs every 6 (six) hours as needed.    [provider]  cyclobenzaprine (FLEXERIL) 10 MG tablet Take 1 tablet (10 mg total) by mouth 2 (two) times daily as needed for muscle spasms. 02/18/20   Rolan Bucco, MD  naproxen (NAPROSYN) 375 MG tablet Take 1 tablet twice daily as needed for pain. 02/17/20   Molpus, Jonny Ruiz, MD      Allergies    Patient has no known allergies.    Review of Systems   Review of Systems  Physical Exam Updated Vital Signs BP 138/79 (BP Location: Right Arm)   Pulse 94   Temp 98.2 F (36.8 C) (Oral)   Resp 18   Ht 5\' 2"  (1.575 m)   Wt 99.8 kg   SpO2 100%   BMI 40.24 kg/m  Physical Exam Vitals and nursing note reviewed.  Constitutional:      Appearance: Normal appearance.  HENT:     Head: Normocephalic and atraumatic.     Mouth/Throat:     Mouth: Mucous membranes are moist.  Eyes:     General: No scleral icterus. Cardiovascular:     Rate and Rhythm: Normal rate and regular rhythm.      Pulses: Normal pulses.     Heart sounds: Normal heart sounds.  Pulmonary:     Effort: Pulmonary effort is normal.     Breath sounds: Normal breath sounds.  Abdominal:     General: Abdomen is flat.     Palpations: Abdomen is soft.     Tenderness: There is no abdominal tenderness.  Musculoskeletal:        General: No deformity.  Skin:    General: Skin is warm.     Findings: No rash.     Comments: Multiple insect bite wound on bilateral legs with skin erythema and mild fluid drainage.  Neurological:     General: No focal deficit present.     Mental Status: She is alert.  Psychiatric:        Mood and Affect: Mood normal.     ED Results / Procedures / Treatments   Labs (all labs ordered are listed, but only abnormal results are displayed) Labs Reviewed - No data to display  EKG None  Radiology No results found.  Procedures Procedures  {Document cardiac monitor, telemetry assessment procedure when appropriate:1}  Medications Ordered in ED Medications - No data to display  ED Course/ Medical Decision Making/ A&P   {   Click here for  ABCD2, HEART and other calculatorsREFRESH Note before signing :1}                          Medical Decision Making  ***  {Document critical care time when appropriate:1} {Document review of labs and clinical decision tools ie heart score, Chads2Vasc2 etc:1}  {Document your independent review of radiology images, and any outside records:1} {Document your discussion with family members, caretakers, and with consultants:1} {Document social determinants of health affecting pt's care:1} {Document your decision making why or why not admission, treatments were needed:1} Final Clinical Impression(s) / ED Diagnoses Final diagnoses:  None    Rx / DC Orders ED Discharge Orders     None

## 2023-06-19 NOTE — Discharge Instructions (Addendum)
Please take your antibiotics as prescribed. I recommend close follow-up with PCP for reevaluation.  Please do not hesitate to return to emergency department if worrisome signs symptoms we discussed become apparent.
